# Patient Record
Sex: Female | Born: 1995 | Race: Black or African American | Hispanic: No | Marital: Single | Smoking: Never smoker
Health system: Southern US, Community
[De-identification: ages and names within clinical notes are randomized; demographics above are authoritative.]

## PROBLEM LIST (undated history)

## (undated) DIAGNOSIS — K219 Gastro-esophageal reflux disease without esophagitis: Secondary | ICD-10-CM

---
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
## 2016-08-23 ENCOUNTER — Encounter (HOSPITAL_COMMUNITY): Payer: Self-pay | Admitting: Emergency Medicine

## 2016-08-23 ENCOUNTER — Ambulatory Visit (HOSPITAL_COMMUNITY)
Admission: EM | Admit: 2016-08-23 | Discharge: 2016-08-23 | Disposition: A | Discharge status: Home / Self Care | Payer: 59 | Attending: Family Medicine | Admitting: Family Medicine

## 2016-08-23 DIAGNOSIS — S46911A Strain of unspecified muscle, fascia and tendon at shoulder and upper arm level, right arm, initial encounter: Secondary | ICD-10-CM

## 2016-08-23 MED ORDER — DICLOFENAC SODIUM 75 MG PO TBEC: 75.0000 mg | DELAYED_RELEASE_TABLET | Freq: Two times a day (BID) | ORAL | 1 refills | Status: DC

## 2016-08-23 NOTE — ED Provider Notes (Signed)
[facility]    CSN: [id number] Arrival date & time: 08/23/16  1605     History   Chief Complaint Chief Complaint  Patient presents with  . Shoulder Pain    HPI Lisa Coffey is a 21 y.o. female.   Is a 21 year old woman who presents with right shoulder pain that is been ongoing for 4 days. 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 Her pain began on Saturday. She notes that the pain is pretty bad when she abducts the upper right arm.  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 GERD (gastroesophageal reflux disease)     There are no active problems to display for this patient.   History reviewed. No pertinent surgical history.  OB History    No data available       Home Medications    Prior to Admission medications   Medication Sig Start Date End Date Taking? Authorizing Provider  levonorgestrel-ethinyl estradiol (SEASONALE) 0.15-0.03 MG tablet Take 1 tablet by mouth daily.   Yes Historical Provider, MD  diclofenac (VOLTAREN) 75 MG EC tablet Take 1 tablet (75 mg total) by mouth 2 (two) times daily. 08/23/16   Elvina Sidle, MD    Family History History reviewed. No pertinent family history.  Social History Social History  Substance Use Topics  . Smoking status: Never Smoker  . Smokeless tobacco: Not on file  . Alcohol use Not on file     Allergies   Patient has no known allergies.   Review of Systems Review of Systems  Constitutional: Negative.   Musculoskeletal: Positive for arthralgias. Negative for neck pain.  Neurological: Negative.      Physical Exam Triage Vital Signs ED Triage Vitals [08/23/16 1719]  Enc Vitals Group     BP 90/75     Pulse Rate 84     Resp 18     Temp  98.5 F (36.9 C)     Temp Source Oral     SpO2 99 %     Weight      Height      Head Circumference      Peak Flow      Pain Score 7     Pain Loc      Pain Edu?      Excl. in GC?    No data found.   Updated Vital Signs BP 90/75 (BP Location: Right Arm)   Pulse 84   Temp 98.5 F (36.9 C) (Oral)   Resp 18   SpO2 99%    Physical Exam  Constitutional: She is oriented to person, place, and time. She appears well-developed and well-nourished.  HENT:  Head: Normocephalic.  Right Ear: External ear normal.  Left Ear: External ear normal.  Mouth/Throat: Oropharynx is clear and moist.  Eyes: Conjunctivae are normal. Pupils are equal, round, and reactive to light.  Neck: Normal range of motion. Neck supple.  Pulmonary/Chest: Effort normal.  Musculoskeletal: She exhibits no edema, tenderness or deformity.  Pain with abduction of right arm. Patient has no localized tenderness, no deformity, no overlying swelling or ecchymosis.  Neurological: She is alert and oriented to person, place, and time.  Skin: Skin is warm and dry.  Nursing note and vitals reviewed.    UC Treatments / Results  Labs (all labs ordered are listed, but only abnormal results are displayed) Labs Reviewed - No data to display  EKG  EKG Interpretation None       Radiology No results found.  Procedures Procedures (including critical care time)  Medications Ordered in UC Medications - No data to display   Initial Impression / Assessment and Plan / UC Course  I have reviewed the triage vital signs and the nursing notes.  Pertinent labs & imaging results that were available during my care of the patient were reviewed by me and considered in my medical decision making (see chart for details).     Final Clinical Impressions(s) / UC Diagnoses   Final diagnoses:  Strain of right shoulder, initial encounter    New Prescriptions New Prescriptions   DICLOFENAC (VOLTAREN) 75 MG EC TABLET    Take 1  tablet (75 mg total) by mouth 2 (two) times daily.     Elvina SidleKurt Marcellino Fidalgo, MD 08/23/16 [phone]

## 2016-08-23 NOTE — ED Triage Notes (Signed)
[facility] a [facility] pain x 4 days. The patient denied any known injury.

## 2019-05-28 ENCOUNTER — Other Ambulatory Visit: Payer: Self-pay

## 2019-05-28 DIAGNOSIS — Z20822 Contact with and (suspected) exposure to covid-19: Secondary | ICD-10-CM

## 2019-05-29 LAB — NOVEL CORONAVIRUS, NAA: SARS-CoV-2, NAA: NOT DETECTED

## 2019-09-04 ENCOUNTER — Ambulatory Visit: Payer: 59 | Attending: Internal Medicine

## 2019-09-04 DIAGNOSIS — Z20822 Contact with and (suspected) exposure to covid-19: Secondary | ICD-10-CM

## 2019-09-05 LAB — NOVEL CORONAVIRUS, NAA: SARS-CoV-2, NAA: NOT DETECTED

## 2019-11-25 ENCOUNTER — Ambulatory Visit: Payer: 59 | Attending: Internal Medicine

## 2019-11-25 DIAGNOSIS — Z20822 Contact with and (suspected) exposure to covid-19: Secondary | ICD-10-CM

## 2019-11-26 LAB — NOVEL CORONAVIRUS, NAA: SARS-CoV-2, NAA: NOT DETECTED

## 2019-11-26 LAB — SARS-COV-2, NAA 2 DAY TAT

## 2021-12-28 ENCOUNTER — Other Ambulatory Visit: Payer: Self-pay | Admitting: Obstetrics and Gynecology

## 2021-12-28 ENCOUNTER — Other Ambulatory Visit (HOSPITAL_COMMUNITY)
Admission: RE | Admit: 2021-12-28 | Discharge: 2021-12-28 | Disposition: A | Discharge status: Home / Self Care | Payer: 59 | Source: Ambulatory Visit | Attending: Obstetrics and Gynecology | Admitting: Obstetrics and Gynecology

## 2021-12-28 DIAGNOSIS — N871 Moderate cervical dysplasia: Secondary | ICD-10-CM | POA: Diagnosis present

## 2021-12-30 LAB — CYTOLOGY - PAP
Comment: NEGATIVE
Comment: NEGATIVE
Comment: NEGATIVE
Diagnosis: HIGH — AB
HPV 16: NEGATIVE
HPV 18 / 45: NEGATIVE
High risk HPV: POSITIVE — AB

## 2022-01-01 ENCOUNTER — Encounter: Payer: Self-pay | Admitting: *Deleted

## 2022-03-24 ENCOUNTER — Encounter (HOSPITAL_BASED_OUTPATIENT_CLINIC_OR_DEPARTMENT_OTHER): Payer: Self-pay | Admitting: Obstetrics and Gynecology

## 2022-03-24 NOTE — Progress Notes (Signed)
Spoke w/ via phone for pre-op interview--- [facility] needs dos----   UPT/ Surgeon orders pending            Lab results------ COVID test -----patient states asymptomatic no test needed Arrive at -------1030 NPO after MN NO Solid Food.  Clear liquids from MN until---0930 Med rec completed Medications to take morning of surgery -----NONE Diabetic medication ----- Patient instructed no nail polish to be worn day of surgery Patient instructed to bring photo id and insurance card day of surgery Patient aware to have Driver (ride ) / caregiver  Mother or father unsure at this time.  for 24 hours after surgery  Patient Special Instructions ----- Pre-Op special Istructions ----- Patient verbalized understanding of instructions that were given at this phone interview. Patient denies shortness of breath, chest pain, fever, cough at this phone interview.

## 2022-04-07 ENCOUNTER — Other Ambulatory Visit: Payer: Self-pay | Admitting: Obstetrics and Gynecology

## 2022-04-07 DIAGNOSIS — N879 Dysplasia of cervix uteri, unspecified: Secondary | ICD-10-CM

## 2022-04-08 ENCOUNTER — Ambulatory Visit (HOSPITAL_BASED_OUTPATIENT_CLINIC_OR_DEPARTMENT_OTHER): Payer: 59 | Admitting: Certified Registered Nurse Anesthetist

## 2022-04-08 ENCOUNTER — Encounter (HOSPITAL_BASED_OUTPATIENT_CLINIC_OR_DEPARTMENT_OTHER)
Admission: RE | Disposition: A | Discharge status: Home / Self Care | Payer: Self-pay | Source: Home / Self Care | Attending: Obstetrics and Gynecology

## 2022-04-08 ENCOUNTER — Other Ambulatory Visit: Payer: Self-pay

## 2022-04-08 ENCOUNTER — Encounter (HOSPITAL_BASED_OUTPATIENT_CLINIC_OR_DEPARTMENT_OTHER): Payer: Self-pay | Admitting: Obstetrics and Gynecology

## 2022-04-08 ENCOUNTER — Ambulatory Visit (HOSPITAL_BASED_OUTPATIENT_CLINIC_OR_DEPARTMENT_OTHER)
Admission: RE | Admit: 2022-04-08 | Discharge: 2022-04-08 | Disposition: A | Discharge status: Home / Self Care | Payer: 59 | Attending: Obstetrics and Gynecology | Admitting: Obstetrics and Gynecology

## 2022-04-08 DIAGNOSIS — D069 Carcinoma in situ of cervix, unspecified: Secondary | ICD-10-CM | POA: Diagnosis present

## 2022-04-08 DIAGNOSIS — N898 Other specified noninflammatory disorders of vagina: Secondary | ICD-10-CM | POA: Diagnosis not present

## 2022-04-08 DIAGNOSIS — Z01818 Encounter for other preprocedural examination: Secondary | ICD-10-CM

## 2022-04-08 DIAGNOSIS — N879 Dysplasia of cervix uteri, unspecified: Secondary | ICD-10-CM

## 2022-04-08 HISTORY — PX: CERVICAL CONIZATION W/BX: SHX1330

## 2022-04-08 LAB — POCT PREGNANCY, URINE: Preg Test, Ur: NEGATIVE

## 2022-04-08 SURGERY — CONE BIOPSY, CERVIX
Anesthesia: General

## 2022-04-08 MED ORDER — DEXMEDETOMIDINE HCL IN NACL 80 MCG/20ML IV SOLN
INTRAVENOUS | Status: AC
  Filled 2022-04-08: qty 20

## 2022-04-08 MED ORDER — PROMETHAZINE HCL 25 MG/ML IJ SOLN: 6.2500 mg | INTRAMUSCULAR | Status: DC | PRN

## 2022-04-08 MED ORDER — PROPOFOL 10 MG/ML IV BOLUS
INTRAVENOUS | Status: AC
  Filled 2022-04-08: qty 20

## 2022-04-08 MED ORDER — PHENYLEPHRINE 80 MCG/ML (10ML) SYRINGE FOR IV PUSH (FOR BLOOD PRESSURE SUPPORT)
PREFILLED_SYRINGE | INTRAVENOUS | Status: AC
  Filled 2022-04-08: qty 10

## 2022-04-08 MED ORDER — ACETAMINOPHEN 500 MG PO TABS
ORAL_TABLET | ORAL | Status: AC
  Filled 2022-04-08: qty 2

## 2022-04-08 MED ORDER — LACTATED RINGERS IV SOLN: INTRAVENOUS | Status: DC

## 2022-04-08 MED ORDER — EPHEDRINE 5 MG/ML INJ
INTRAVENOUS | Status: AC
  Filled 2022-04-08: qty 5

## 2022-04-08 MED ORDER — OXYCODONE HCL 5 MG PO TABS: 5.0000 mg | ORAL_TABLET | Freq: Once | ORAL | Status: DC | PRN

## 2022-04-08 MED ORDER — HEMOSTATIC AGENTS (NO CHARGE) OPTIME
TOPICAL | Status: DC | PRN
  Administered 2022-04-08: 1 via TOPICAL

## 2022-04-08 MED ORDER — MIDAZOLAM HCL 2 MG/2ML IJ SOLN
INTRAMUSCULAR | Status: DC | PRN
  Administered 2022-04-08: 2 mg via INTRAVENOUS

## 2022-04-08 MED ORDER — DEXAMETHASONE SODIUM PHOSPHATE 10 MG/ML IJ SOLN
INTRAMUSCULAR | Status: AC
  Filled 2022-04-08: qty 4

## 2022-04-08 MED ORDER — PROPOFOL 10 MG/ML IV BOLUS
INTRAVENOUS | Status: DC | PRN
  Administered 2022-04-08: 200 mg via INTRAVENOUS

## 2022-04-08 MED ORDER — ACETAMINOPHEN 500 MG PO TABS: 1000.0000 mg | ORAL_TABLET | Freq: Three times a day (TID) | ORAL | Status: AC | PRN

## 2022-04-08 MED ORDER — DEXAMETHASONE SODIUM PHOSPHATE 10 MG/ML IJ SOLN
INTRAMUSCULAR | Status: DC | PRN
  Administered 2022-04-08: 10 mg via INTRAVENOUS

## 2022-04-08 MED ORDER — ONDANSETRON HCL 4 MG/2ML IJ SOLN
INTRAMUSCULAR | Status: DC | PRN
  Administered 2022-04-08: 4 mg via INTRAVENOUS

## 2022-04-08 MED ORDER — AMISULPRIDE (ANTIEMETIC) 5 MG/2ML IV SOLN: 10.0000 mg | Freq: Once | INTRAVENOUS | Status: DC | PRN

## 2022-04-08 MED ORDER — OXYCODONE HCL 5 MG/5ML PO SOLN: 5.0000 mg | Freq: Once | ORAL | Status: DC | PRN

## 2022-04-08 MED ORDER — LIDOCAINE HCL (PF) 2 % IJ SOLN
INTRAMUSCULAR | Status: AC
  Filled 2022-04-08: qty 25

## 2022-04-08 MED ORDER — DEXMEDETOMIDINE HCL IN NACL 80 MCG/20ML IV SOLN
INTRAVENOUS | Status: DC | PRN
  Administered 2022-04-08: 8 ug via BUCCAL

## 2022-04-08 MED ORDER — MIDAZOLAM HCL 2 MG/2ML IJ SOLN
INTRAMUSCULAR | Status: AC
  Filled 2022-04-08: qty 2

## 2022-04-08 MED ORDER — MEPERIDINE HCL 25 MG/ML IJ SOLN: 6.2500 mg | INTRAMUSCULAR | Status: DC | PRN

## 2022-04-08 MED ORDER — IODINE STRONG (LUGOLS) 5 % PO SOLN
ORAL | Status: DC | PRN
  Administered 2022-04-08: 0.1 mL via ORAL

## 2022-04-08 MED ORDER — ONDANSETRON HCL 4 MG/2ML IJ SOLN
INTRAMUSCULAR | Status: AC
  Filled 2022-04-08: qty 8

## 2022-04-08 MED ORDER — FERRIC SUBSULFATE (BULK) SOLN
Status: DC | PRN
  Administered 2022-04-08: 1

## 2022-04-08 MED ORDER — LIDOCAINE-EPINEPHRINE 1 %-1:100000 IJ SOLN
INTRAMUSCULAR | Status: DC | PRN
  Administered 2022-04-08: 20 mL

## 2022-04-08 MED ORDER — LIDOCAINE 2% (20 MG/ML) 5 ML SYRINGE
INTRAMUSCULAR | Status: DC | PRN
  Administered 2022-04-08: 60 mg via INTRAVENOUS

## 2022-04-08 MED ORDER — ACETAMINOPHEN 500 MG PO TABS
1000.0000 mg | ORAL_TABLET | ORAL | Status: AC
  Administered 2022-04-08: 1000 mg via ORAL

## 2022-04-08 MED ORDER — FENTANYL CITRATE (PF) 250 MCG/5ML IJ SOLN
INTRAMUSCULAR | Status: DC | PRN
  Administered 2022-04-08: 50 ug via INTRAVENOUS

## 2022-04-08 MED ORDER — KETOROLAC TROMETHAMINE 30 MG/ML IJ SOLN
INTRAMUSCULAR | Status: AC
  Filled 2022-04-08: qty 2

## 2022-04-08 MED ORDER — KETOROLAC TROMETHAMINE 30 MG/ML IJ SOLN
INTRAMUSCULAR | Status: DC | PRN
  Administered 2022-04-08: 30 mg via INTRAVENOUS

## 2022-04-08 MED ORDER — HYDROMORPHONE HCL 1 MG/ML IJ SOLN: 0.2500 mg | INTRAMUSCULAR | Status: DC | PRN

## 2022-04-08 MED ORDER — FENTANYL CITRATE (PF) 100 MCG/2ML IJ SOLN
INTRAMUSCULAR | Status: AC
  Filled 2022-04-08: qty 2

## 2022-04-08 MED ORDER — ROCURONIUM BROMIDE 10 MG/ML (PF) SYRINGE
PREFILLED_SYRINGE | INTRAVENOUS | Status: AC
  Filled 2022-04-08: qty 20

## 2022-04-08 MED ORDER — IBUPROFEN 800 MG PO TABS: 800.0000 mg | ORAL_TABLET | Freq: Three times a day (TID) | ORAL | 0 refills | Status: AC | PRN

## 2022-04-08 MED ORDER — POVIDONE-IODINE 10 % EX SWAB: 2.0000 | Freq: Once | CUTANEOUS | Status: DC

## 2022-04-08 MED ORDER — 0.9 % SODIUM CHLORIDE (POUR BTL) OPTIME
TOPICAL | Status: DC | PRN
  Administered 2022-04-08: 500 mL

## 2022-04-08 SURGICAL SUPPLY — 21 items
BLADE SURG 11 STRL SS (BLADE) ×1 IMPLANT
ELECT BALL LEEP 5MM RED (ELECTRODE) ×1 IMPLANT
ELECT REM PT RETURN 9FT ADLT (ELECTROSURGICAL) ×1
ELECTRODE REM PT RTRN 9FT ADLT (ELECTROSURGICAL) ×1 IMPLANT
GAUZE 4X4 16PLY ~~LOC~~+RFID DBL (SPONGE) ×3 IMPLANT
GLOVE BIOGEL M 6.5 STRL (GLOVE) ×1 IMPLANT
GLOVE BIOGEL PI IND STRL 6.5 (GLOVE) ×2 IMPLANT
GLOVE BIOGEL PI IND STRL 7.0 (GLOVE) ×1 IMPLANT
GOWN STRL REUS W/TWL LRG LVL3 (GOWN DISPOSABLE) ×2 IMPLANT
HEMOSTAT SURGICEL 4X8 (HEMOSTASIS) IMPLANT
KIT TURNOVER CYSTO (KITS) ×1 IMPLANT
NS IRRIG 500ML POUR BTL (IV SOLUTION) IMPLANT
PACK VAGINAL WOMENS (CUSTOM PROCEDURE TRAY) ×1 IMPLANT
PAD OB MATERNITY 4.3X12.25 (PERSONAL CARE ITEMS) ×1 IMPLANT
SCOPETTES 8  STERILE (MISCELLANEOUS) ×1
SCOPETTES 8 STERILE (MISCELLANEOUS) ×1 IMPLANT
SLEEVE SCD COMPRESS KNEE MED (STOCKING) ×1 IMPLANT
SUT VIC AB 0 CT1 27 (SUTURE) ×1
SUT VIC AB 0 CT1 27XBRD ANBCTR (SUTURE) ×2 IMPLANT
TOWEL OR 17X26 10 PK STRL BLUE (TOWEL DISPOSABLE) ×2 IMPLANT
TUBE CONNECTING 12X1/4 (SUCTIONS) ×2 IMPLANT

## 2022-04-08 NOTE — Op Note (Signed)
04/08/2022  1:42 PM  PATIENT:  Lisa Coffey  26 y.o. female  PRE-OPERATIVE DIAGNOSIS:  Cervical Intraeithelial Neoplasia Grade III  POST-OPERATIVE DIAGNOSIS:  Cervical Intraeithelial Neoplasia Grade III  PROCEDURE:  Procedure(s): CONIZATION CERVIX WITH BIOPSY (N/A)  SURGEON:  Surgeon(s) and Role:    Christophe Louis, MD - Primary  PHYSICIAN ASSISTANT:   ASSISTANTS: none   ANESTHESIA:   general  EBL:  5 mL   BLOOD ADMINISTERED:none  DRAINS: none   LOCAL MEDICATIONS USED:  LIDOCAINE   SPECIMEN:  Source of Specimen:  cervical cone   DISPOSITION OF SPECIMEN:  PATHOLOGY  COUNTS:  YES  TOURNIQUET:  * No tourniquets in log *  DICTATION: .Note written in EPIC  PLAN OF CARE: Discharge to home after PACU  PATIENT DISPOSITION:  PACU - hemodynamically stable.   Delay start of Pharmacological VTE agent (>24hrs) due to surgical blood loss or risk of bleeding: not applicable  Findings: normal external genitalia. Normal vaginal mucosa.. with application Lugols the cervical tissue uptake was dark in all 4 quadrants no suspicious lesions were visualized.   Procedure. Pt was taken to the operating room where she was placed under general anesthesia. Time out was performed. She was prepped and draped in a sterile fashion. Speculum was placed. The anterior lip of the cervix was grasped with single toothed tenaculum.  10 cc of 1% lidocaine with epi was placed at the 4 and 8 o'clock positions. A suture of 0 vicryl was placed at the 3 and 9 o'clock positions and used for retraction. The single toothed tenaculum was removed. Lugol's  Was placed on the cervix with the findings noted above. Scalpel was used to excise a cone portion of the cervix. The specimen was marked with a suture at the 12 o'clock position and sent to pathology. Hemostasis was obtained with roller ball and Monsel. surgicel was placed in the wound bed. The 3 and 9 o clock sutures were  were cut.  Excellent hemostasis was noted.   Sponge lap and needle counts were correct x 2. Pt was awakened from anesthesia and taken to the recovery room in stable condition.

## 2022-04-08 NOTE — H&P (Signed)
Reason for Appointment 1.  Cervical Dysplasia... Preop Appt History of Present Illness General:         26 y/o presents for preop visit. Pt is schedule for a Cold knife Conization on 04/08/2022 for the managment for CIN III.        Pap smear: 12/28/2021 revealed HGSIL with HRHPV detected.        Colposcopy on 02/14/2022 revealed CIN 2-3 at the 12 oclock position and on ECC.        CINI was seen at the 6 oclock position.  Current Medications Taking Levonorgestrel-Ethinyl Estrad 0.15-30 MG-MCG Tablet 1 tablet Orally Once a day MVI Medication List reviewed and reconciled with the patient Past Medical History      Medical History Verified. Surgical History       Denies Past Surgical History Family History Father: alive, High cholesterol Mother: alive, borderline diabetic Paternal Grand Father: alive, stroke Paternal Grand Mother: deceased, hx of stroke Maternal Grand Father: deceased Maternal Grand Mother: alive Sister 1: alive, Type I diabetes 1 brother(s) , 2 sister(s) - healthy. pt is a twin. Social History General:  Tobacco use  cigarettes:  light tobacco smoker hookah occassionally, Tobacco history last updated  03/28/2022, Vaping  No. Alcohol: yes, occasionally. Caffeine: yes, coffee, occasionally. no Recreational drug use. Marital Status: single. OCCUPATION: employed, Child psychotherapist.. Gyn History Sexual activity currently sexually active.  Periods : irregular.  LMP 2 weeks ago.  Birth control OCP.  Last pap smear date 12/28/2021- HGSIL/HPV+.  Last mammogram date N/A.  H/O Abnormal pap smear 01/08/2021- ASCUS with HRHPV 06/21/2021- neg/HPV+ 12/28/2021- HGSIL/HPV+.  STD HSV1 and HSV2, HPV.  Menarche 11.  GYN procedures Colposcopy in 2022 - CIN II.  OB History Never been pregnant  per patient.  Allergies N.K.D.A. Hospitalization/Major Diagnostic Procedure Denies Past Hospitalization Review of Systems CONSTITUTIONAL:         Chills No.  Fatigue No.  Fever No.  Night sweats  No.  Recent travel outside [place] No.  Sweats No.  Weight change No.     OPHTHALMOLOGY:         Blurring of vision no.  Change in vision no.  Double vision no.     ENT:         Dizziness no.  Nose bleeds no.  Sore throat no.  Teeth pain no.     ALLERGY:         Hives no.     CARDIOLOGY:         Chest pain no.  High blood pressure no.  Irregular heart beat no.  Leg edema no.  Palpitations no.     RESPIRATORY:         Shortness of breath no.  Cough no.  Wheezing no.     UROLOGY:         Pain with urination no.  Urinary urgency no.  Urinary frequency no.  Urinary incontinence no.  Difficulty urinating No.  Blood in urine No.     GASTROENTEROLOGY:         Abdominal pain no.  Appetite change no.  Bloating/belching no.  Blood in stool or on toilet paper no.  Change in bowel movements no.  Constipation no.  Diarrhea no.  Difficulty swallowing no.  Nausea no.     FEMALE REPRODUCTIVE:         Vulvar pain no.  Vulvar rash no.  Abnormal vaginal bleeding no.  Breast pain no.  Nipple discharge no.  Pain with intercourse  no.  Pelvic pain no.  Unusual vaginal discharge no.  Vaginal itching no.     MUSCULOSKELETAL:         Muscle aches no.     NEUROLOGY:         Headache no.  Tingling/numbness no.  Weakness no.     PSYCHOLOGY:         Depression no.  Anxiety no.  Nervousness no.  Sleep disturbances no.  Suicidal ideation no .     ENDOCRINOLOGY:         Excessive thirst no.  Excessive urination no.  Hair loss no.  Heat or cold intolerance no.     HEMATOLOGY/LYMPH:         Abnormal bleeding no.  Easy bruising no.  Swollen glands no.     DERMATOLOGY:         New/changing skin lesion no.  Rash no.  Sores no. Vital Signs Wt 144.2, Wt change 1.5 lbs, Ht 64.5, BMI 24.37, Pulse sitting 84, BP sitting 119/73. Examination General Examination:        CONSTITUTIONAL: alert, oriented, NAD . SKIN:  moist, warm. EYES:  Conjunctiva clear. LUNGS:  good I:E efffort noted, clear to auscultation bilaterally. HEART:   regular rate and rhythm. ABDOMEN: soft, non-tender/non-distended, bowel sounds present . FEMALE GENITOURINARY: normal external genitalia, labia - unremarkable, vagina - pink moist mucosa, no lesions .. white discharge in the vaginal vault , cervix - no discharge or lesions or CMT, adnexa - no masses or tenderness, uterus - nontender and normal size on palpation . PSYCH:  affect normal, good eye contact.  Physical Examination Chaperone present:         Chaperone present  Sharyl Nimrod 01/75/1025 12:03:54 PM > , for pelvic exam.      Assessments 1. CIN III (cervical intraepithelial neoplasia grade III) with severe dysplasia - D06.9 (Primary) 2. Vaginal discharge - N89.8 Treatment 1. CIN III (cervical intraepithelial neoplasia grade III) with severe dysplasia  Notes: Pt is scheduled for cold knife conization (CKC) . Discussed w/ pt procedure, risks, and benefits of cold knife conization including but not limited to infection/bleeding, and damage surrounding organs with the need for further surgery. R/O cervical stenosis and Cervical incompetence after having CKC discussed. She voiced understanding and is ready to proceed with conization. Pt advised to avoid drinking anything midnight prior to her surgery. Discussed avoidance of NSAIDs (Ibuprofen, Advil, Aleve) at least 10 days prior to surgery. she is advised to avoid the use of tampons or intercourse for 4 weeks following procedure. Plan for 4 wk post-op visit after surgery..    2. Vaginal discharge       LAB: [facility]        Lab: [facility]       Value Reference Range      WBCS Normal  Normal -      CLUE CELLS Moderate A None seen -      TRICHOMONAS None Seen  None Seen -      YEAST None seen  None seen -             Manjot Hinks 03/30/2022 08:00:54 PM > pt has bacterial vaginosis. call in flagyl 500 mg sig 1 po bid #14 no refills. pt should avoid drinking any alcohol while taking this medication. .. recommend dove sensitive skin soap and  avoid wearing underwear at night unless on menses. Student Loreen Freud, eCW 03/31/2022 09:03:42 AM > Spoke with pt and informed  her that she had bacterial vaginosis and that we have sent Flagyl over to her preferred pharmacy. Informed pt of the directions and how the medication should be taken. This lab was reviewed by Beather Arbour on 03/31/2022 at 09:08 AM EDT

## 2022-04-08 NOTE — H&P (Signed)
Date of Initial H&P: 04/08/2022  History reviewed, patient examined, no change in status, stable for surgery.

## 2022-04-08 NOTE — Anesthesia Procedure Notes (Signed)
Procedure Name: LMA Insertion Date/Time: 04/08/2022 12:50 PM  Performed by: Clearnce Sorrel, CRNAPre-anesthesia Checklist: Patient identified, Emergency Drugs available, Suction available and Patient being monitored Patient Re-evaluated:Patient Re-evaluated prior to induction Oxygen Delivery Method: Circle System Utilized Preoxygenation: Pre-oxygenation with 100% oxygen Induction Type: IV induction Ventilation: Mask ventilation without difficulty LMA: LMA inserted LMA Size: 4.0 Number of attempts: 1 Airway Equipment and Method: Bite block Placement Confirmation: positive ETCO2 Tube secured with: Tape Dental Injury: Teeth and Oropharynx as per pre-operative assessment

## 2022-04-08 NOTE — Anesthesia Preprocedure Evaluation (Signed)
Anesthesia Evaluation  Patient identified by MRN, date of birth, ID band Patient awake    Reviewed: Allergy & Precautions, NPO status , Patient's Chart, lab work & pertinent test results  Airway Mallampati: II  TM Distance: >3 FB Neck ROM: Full    Dental no notable dental hx.    Pulmonary neg pulmonary ROS,    Pulmonary exam normal breath sounds clear to auscultation       Cardiovascular negative cardio ROS Normal cardiovascular exam Rhythm:Regular Rate:Normal     Neuro/Psych negative neurological ROS  negative psych ROS   GI/Hepatic Neg liver ROS, GERD  ,  Endo/Other  negative endocrine ROS  Renal/GU negative Renal ROS  negative genitourinary   Musculoskeletal negative musculoskeletal ROS (+)   Abdominal   Peds negative pediatric ROS (+)  Hematology negative hematology ROS (+)   Anesthesia Other Findings   Reproductive/Obstetrics negative OB ROS                             Anesthesia Physical Anesthesia Plan  ASA: 2  Anesthesia Plan: General   Post-op Pain Management: Tylenol PO (pre-op)* and Minimal or no pain anticipated   Induction: Intravenous  PONV Risk Score and Plan: 3 and Ondansetron, Dexamethasone, Midazolam and Treatment may vary due to age or medical condition  Airway Management Planned: LMA  Additional Equipment:   Intra-op Plan:   Post-operative Plan: Extubation in OR  Informed Consent: I have reviewed the patients History and Physical, chart, labs and discussed the procedure including the risks, benefits and alternatives for the proposed anesthesia with the patient or authorized representative who has indicated his/her understanding and acceptance.     Dental advisory given  Plan Discussed with: CRNA  Anesthesia Plan Comments:         Anesthesia Quick Evaluation

## 2022-04-08 NOTE — Transfer of Care (Signed)
Immediate Anesthesia Transfer of Care Note  Patient: Lisa Coffey  Procedure(s) Performed: CONIZATION CERVIX WITH BIOPSY  Patient Location: PACU  Anesthesia Type:General  Level of Consciousness: drowsy and patient cooperative  Airway & Oxygen Therapy: Patient Spontanous Breathing  Post-op Assessment: Report given to RN and Post -op Vital signs reviewed and stable  Post vital signs: Reviewed and stable  Last Vitals:  Vitals Value Taken Time  BP 102/55 04/08/22 1339  Temp    Pulse    Resp 21 04/08/22 1340  SpO2    Vitals shown include unvalidated device data.  Last Pain:  Vitals:   04/08/22 1020  TempSrc: Oral  PainSc: 0-No pain      Patients Stated Pain Goal: 5 (61/22/44 [id number])  Complications: No notable events documented.

## 2022-04-08 NOTE — Discharge Instructions (Signed)

## 2022-04-11 ENCOUNTER — Encounter (HOSPITAL_BASED_OUTPATIENT_CLINIC_OR_DEPARTMENT_OTHER): Payer: Self-pay | Admitting: Obstetrics and Gynecology

## 2022-04-11 LAB — SURGICAL PATHOLOGY

## 2022-04-11 NOTE — Anesthesia Postprocedure Evaluation (Signed)
Anesthesia Post Note  Patient: Lisa Coffey  Procedure(s) Performed: CONIZATION CERVIX WITH BIOPSY     Patient location during evaluation: PACU Anesthesia Type: General Level of consciousness: awake and alert Pain management: pain level controlled Vital Signs Assessment: post-procedure vital signs reviewed and stable Respiratory status: spontaneous breathing, nonlabored ventilation and respiratory function stable Cardiovascular status: blood pressure returned to baseline and stable Postop Assessment: no apparent nausea or vomiting Anesthetic complications: no   No notable events documented.  Last Vitals:  Vitals:   04/08/22 1410 04/08/22 1435  BP: 109/63 113/71  Pulse: 77   Resp: 12 14  Temp:  36.4 C  SpO2: 100% 100%    Last Pain:  Vitals:   04/08/22 1435  TempSrc: Oral  PainSc: 0-No pain                 Lynda Rainwater

## 2022-11-16 ENCOUNTER — Other Ambulatory Visit (HOSPITAL_COMMUNITY)
Admission: RE | Admit: 2022-11-16 | Discharge: 2022-11-16 | Disposition: A | Discharge status: Home / Self Care | Payer: 59 | Source: Ambulatory Visit | Attending: Obstetrics and Gynecology | Admitting: Obstetrics and Gynecology

## 2022-11-16 DIAGNOSIS — D069 Carcinoma in situ of cervix, unspecified: Secondary | ICD-10-CM | POA: Diagnosis present

## 2022-11-16 DIAGNOSIS — Z01419 Encounter for gynecological examination (general) (routine) without abnormal findings: Secondary | ICD-10-CM | POA: Diagnosis present

## 2022-11-18 LAB — CYTOLOGY - PAP
Adequacy: ABSENT
Comment: NEGATIVE
Diagnosis: NEGATIVE
High risk HPV: NEGATIVE

## 2023-05-19 ENCOUNTER — Other Ambulatory Visit (HOSPITAL_COMMUNITY)
Admission: RE | Admit: 2023-05-19 | Discharge: 2023-05-19 | Disposition: A | Discharge status: Home / Self Care | Payer: 59 | Source: Ambulatory Visit | Attending: Obstetrics and Gynecology | Admitting: Obstetrics and Gynecology

## 2023-05-19 DIAGNOSIS — D069 Carcinoma in situ of cervix, unspecified: Secondary | ICD-10-CM | POA: Diagnosis present

## 2023-05-25 LAB — CYTOLOGY - PAP
Adequacy: ABSENT
Comment: NEGATIVE
Diagnosis: NEGATIVE
High risk HPV: NEGATIVE

## 2023-11-24 ENCOUNTER — Other Ambulatory Visit (HOSPITAL_COMMUNITY)
Admission: RE | Admit: 2023-11-24 | Discharge: 2023-11-24 | Disposition: A | Discharge status: Home / Self Care | Source: Ambulatory Visit | Attending: Obstetrics and Gynecology | Admitting: Obstetrics and Gynecology

## 2023-11-24 DIAGNOSIS — D069 Carcinoma in situ of cervix, unspecified: Secondary | ICD-10-CM | POA: Insufficient documentation

## 2023-11-24 DIAGNOSIS — Z01419 Encounter for gynecological examination (general) (routine) without abnormal findings: Secondary | ICD-10-CM | POA: Insufficient documentation

## 2023-11-28 LAB — CYTOLOGY - PAP
Adequacy: ABSENT
Comment: NEGATIVE
Diagnosis: NEGATIVE
High risk HPV: NEGATIVE

## 2024-05-22 ENCOUNTER — Other Ambulatory Visit (HOSPITAL_COMMUNITY)
Admission: RE | Admit: 2024-05-22 | Discharge: 2024-05-22 | Disposition: A | Source: Ambulatory Visit | Attending: Obstetrics and Gynecology | Admitting: Obstetrics and Gynecology

## 2024-05-22 DIAGNOSIS — D069 Carcinoma in situ of cervix, unspecified: Secondary | ICD-10-CM | POA: Diagnosis present

## 2024-05-24 LAB — CYTOLOGY - PAP
Adequacy: ABSENT
Diagnosis: NEGATIVE
